# Patient Record
Sex: Male | Born: 1985 | Race: Black or African American | Hispanic: No | Marital: Single | Smoking: Current every day smoker
Health system: Southern US, Community
[De-identification: ages and names within clinical notes are randomized; demographics above are authoritative.]

## PROBLEM LIST (undated history)

## (undated) DIAGNOSIS — L309 Dermatitis, unspecified: Secondary | ICD-10-CM

---
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
## 2006-02-22 ENCOUNTER — Emergency Department (HOSPITAL_COMMUNITY): Admission: EM | Admit: 2006-02-22 | Discharge: 2006-02-22 | Payer: Self-pay | Admitting: Emergency Medicine

## 2008-09-26 ENCOUNTER — Emergency Department (HOSPITAL_COMMUNITY): Admission: EM | Admit: 2008-09-26 | Discharge: 2008-09-26 | Payer: Self-pay | Admitting: Emergency Medicine

## 2008-12-11 ENCOUNTER — Emergency Department (HOSPITAL_COMMUNITY): Admission: EM | Admit: 2008-12-11 | Discharge: 2008-12-11 | Payer: Self-pay | Admitting: Emergency Medicine

## 2010-04-19 ENCOUNTER — Emergency Department (HOSPITAL_COMMUNITY): Admission: EM | Admit: 2010-04-19 | Discharge: 2010-04-19 | Payer: Self-pay | Admitting: Emergency Medicine

## 2010-07-09 ENCOUNTER — Emergency Department (HOSPITAL_COMMUNITY): Admission: EM | Admit: 2010-07-09 | Discharge: 2010-07-09 | Payer: Self-pay | Admitting: Family Medicine

## 2010-08-19 ENCOUNTER — Emergency Department (HOSPITAL_COMMUNITY)
Admission: EM | Admit: 2010-08-19 | Discharge: 2010-08-19 | Payer: Self-pay | Source: Home / Self Care | Admitting: Family Medicine

## 2010-11-04 LAB — URINALYSIS, ROUTINE W REFLEX MICROSCOPIC
Hgb urine dipstick: NEGATIVE
Ketones, ur: NEGATIVE mg/dL
Nitrite: NEGATIVE
Protein, ur: NEGATIVE mg/dL
Specific Gravity, Urine: 1.029 (ref 1.005–1.030)
Urobilinogen, UA: 1 mg/dL (ref 0.0–1.0)
pH: 6 (ref 5.0–8.0)

## 2010-11-04 LAB — DIFFERENTIAL
Basophils Relative: 0 % (ref 0–1)
Eosinophils Absolute: 0.1 10*3/uL (ref 0.0–0.7)
Eosinophils Relative: 1 % (ref 0–5)
Lymphocytes Relative: 46 % (ref 12–46)
Monocytes Absolute: 0.6 10*3/uL (ref 0.1–1.0)
Neutrophils Relative %: 43 % (ref 43–77)

## 2010-11-04 LAB — CBC
Hemoglobin: 13.7 g/dL (ref 13.0–17.0)
Platelets: 214 10*3/uL (ref 150–400)
RBC: 4.94 MIL/uL (ref 4.22–5.81)
WBC: 5.8 10*3/uL (ref 4.0–10.5)

## 2010-11-04 LAB — BASIC METABOLIC PANEL
GFR calc non Af Amer: >60 mL/min (ref >60)
Potassium: 3.7 mEq/L (ref 3.5–5.1)

## 2010-11-30 LAB — RAPID STREP SCREEN (MED CTR MEBANE ONLY): Streptococcus, Group A Screen (Direct): NEGATIVE

## 2011-01-09 ENCOUNTER — Inpatient Hospital Stay (HOSPITAL_COMMUNITY)
Admission: RE | Admit: 2011-01-09 | Discharge: 2011-01-09 | Disposition: A | Discharge status: Home / Self Care | Payer: Self-pay | Source: Ambulatory Visit | Attending: Family Medicine | Admitting: Family Medicine

## 2011-02-08 ENCOUNTER — Emergency Department (HOSPITAL_COMMUNITY)
Admission: EM | Admit: 2011-02-08 | Discharge: 2011-02-08 | Discharge status: Against Medical Advice | Payer: Self-pay | Attending: Emergency Medicine | Admitting: Emergency Medicine

## 2011-02-08 DIAGNOSIS — L0231 Cutaneous abscess of buttock: Secondary | ICD-10-CM | POA: Insufficient documentation

## 2011-02-08 DIAGNOSIS — IMO0001 Reserved for inherently not codable concepts without codable children: Secondary | ICD-10-CM | POA: Insufficient documentation

## 2011-02-08 DIAGNOSIS — R109 Unspecified abdominal pain: Secondary | ICD-10-CM | POA: Insufficient documentation

## 2011-02-08 DIAGNOSIS — L02219 Cutaneous abscess of trunk, unspecified: Secondary | ICD-10-CM | POA: Insufficient documentation

## 2011-02-08 DIAGNOSIS — F17200 Nicotine dependence, unspecified, uncomplicated: Secondary | ICD-10-CM | POA: Insufficient documentation

## 2011-05-13 ENCOUNTER — Emergency Department (HOSPITAL_COMMUNITY)
Admission: EM | Admit: 2011-05-13 | Discharge: 2011-05-13 | Disposition: A | Discharge status: Home / Self Care | Payer: Self-pay | Attending: Emergency Medicine | Admitting: Emergency Medicine

## 2011-05-13 DIAGNOSIS — M542 Cervicalgia: Secondary | ICD-10-CM | POA: Insufficient documentation

## 2011-05-13 DIAGNOSIS — S139XXA Sprain of joints and ligaments of unspecified parts of neck, initial encounter: Secondary | ICD-10-CM | POA: Insufficient documentation

## 2011-05-21 IMAGING — US US ART/VEN ABD/PELV/SCROTUM DOPPLER LTD
1 series · 13 of 25 positions shown · non-contrast
Comparison: None available.

CLINICAL DATA: Testicular swelling and pain, lump

SCROTAL ULTRASOUND
DOPPLER ULTRASOUND OF THE TESTICLES
TECHNIQUE: Complete ultrasound examination of the testicles,
epididymis, and other scrotal structures was performed.  Color and
spectral Doppler ultrasound were also utilized to evaluate blood
flow to the testicles.

[Series 1: us art/ven abd/pelv/scrotum doppler ltd · 0.07mm/px · 13 of 60 slices shown]
[im 1/60]
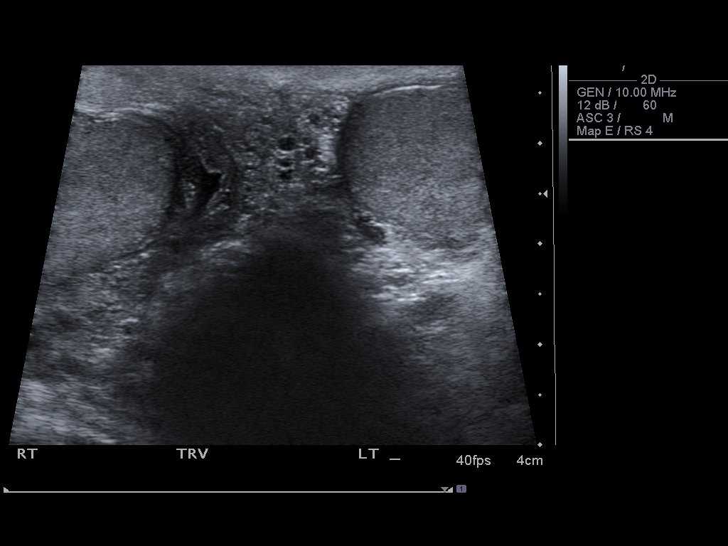
[im 5/60]
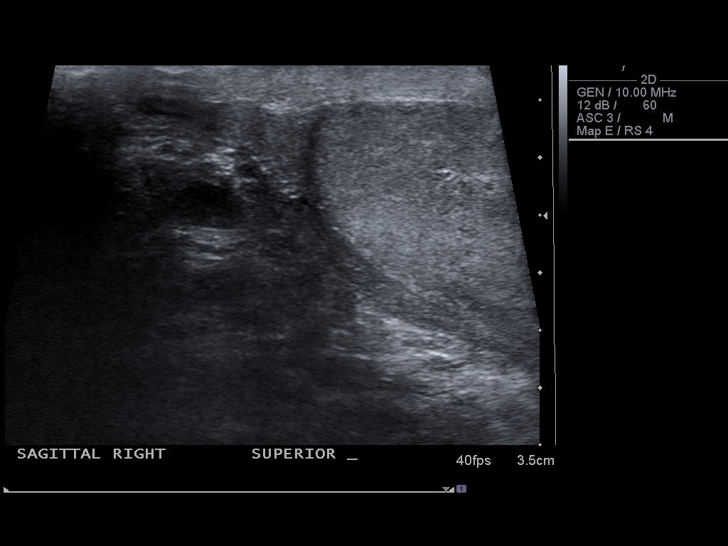
[im 10/60]
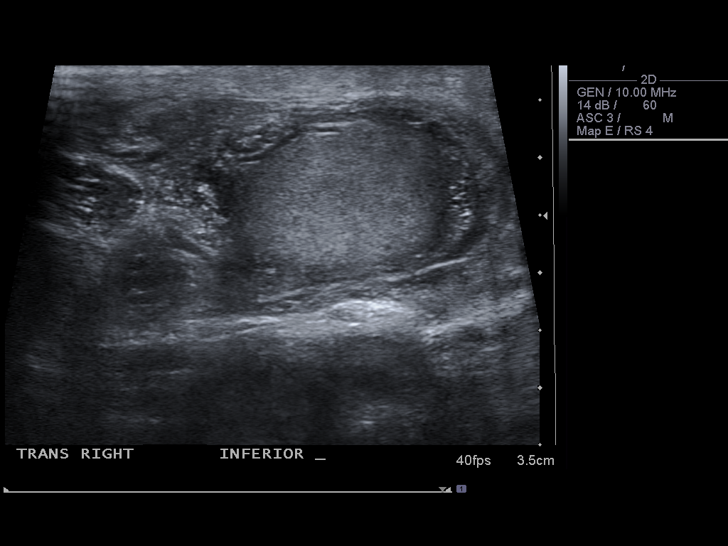
[im 15/60]
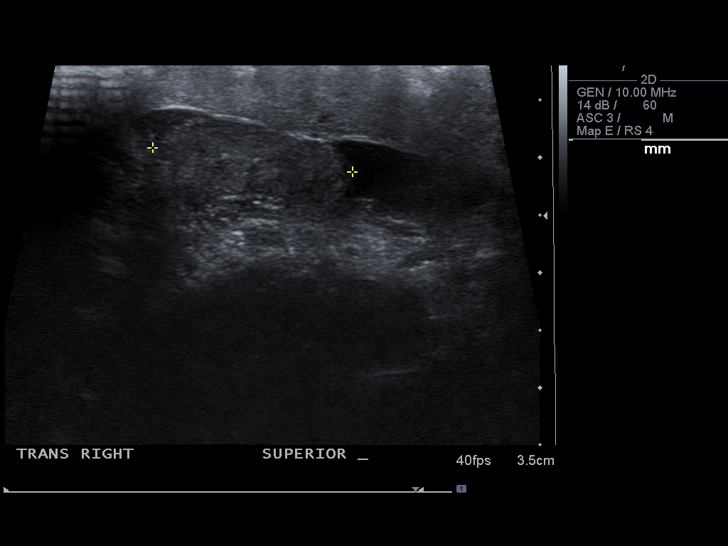
[im 20/60]
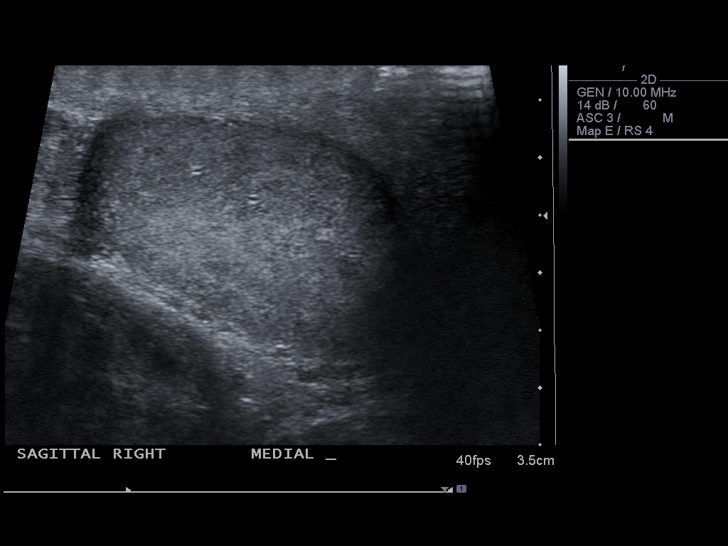
[im 25/60]
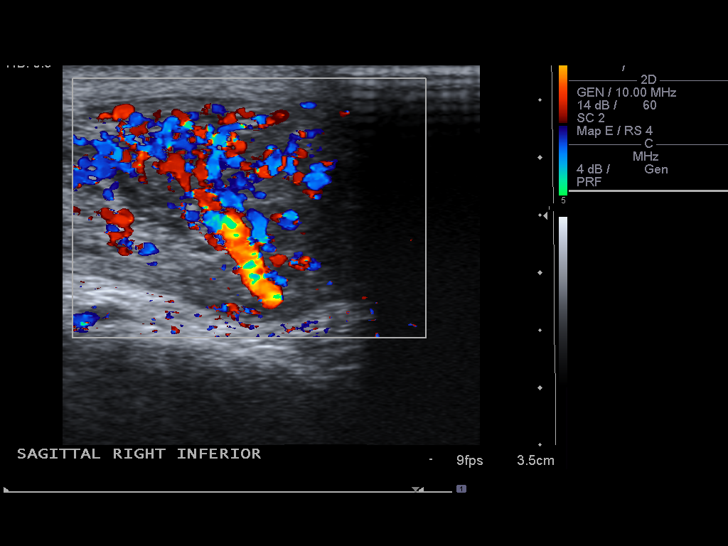
[im 30/60]
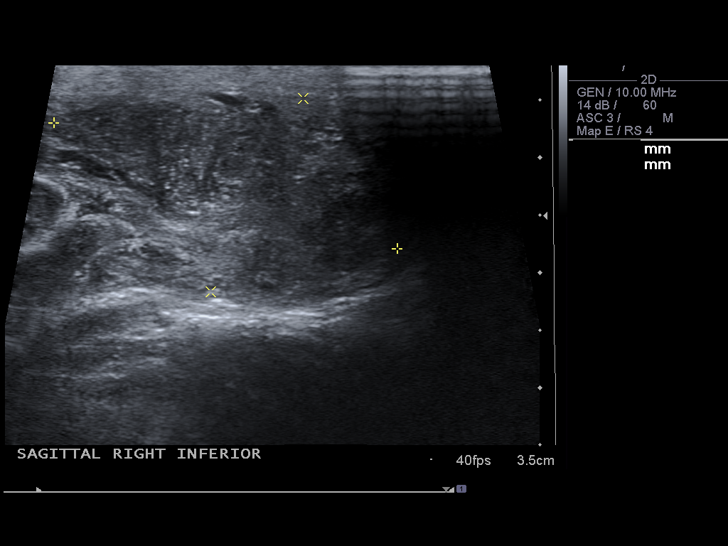
[im 35/60]
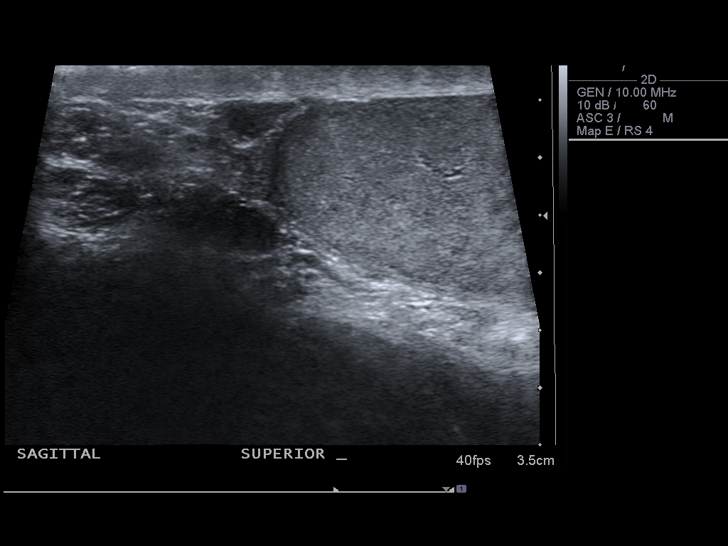
[im 40/60]
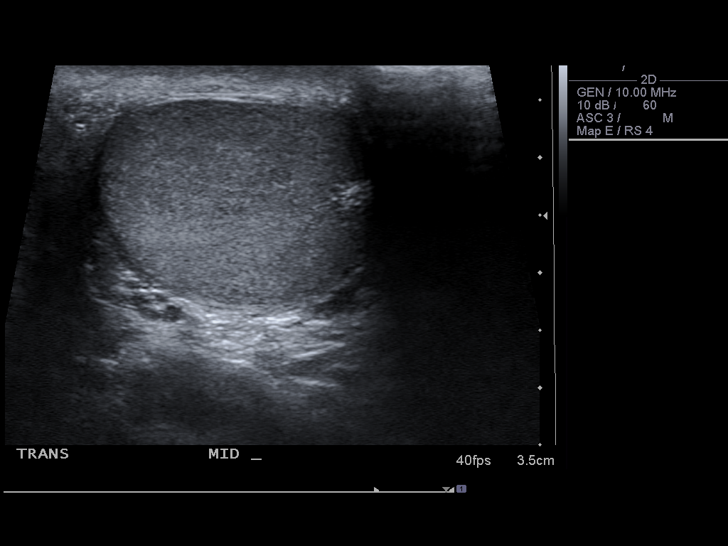
[im 45/60]
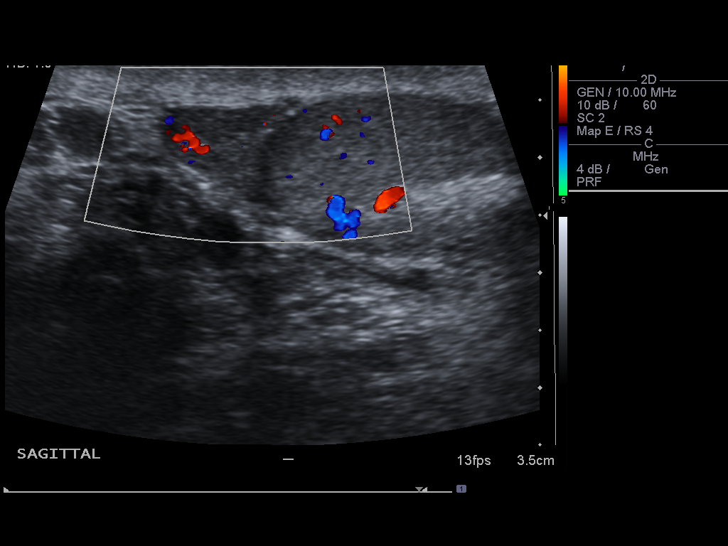
[im 50/60]
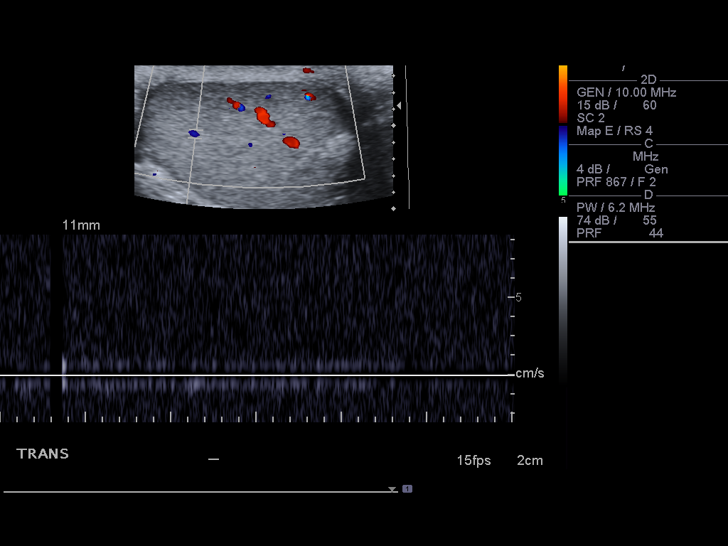
[im 55/60]
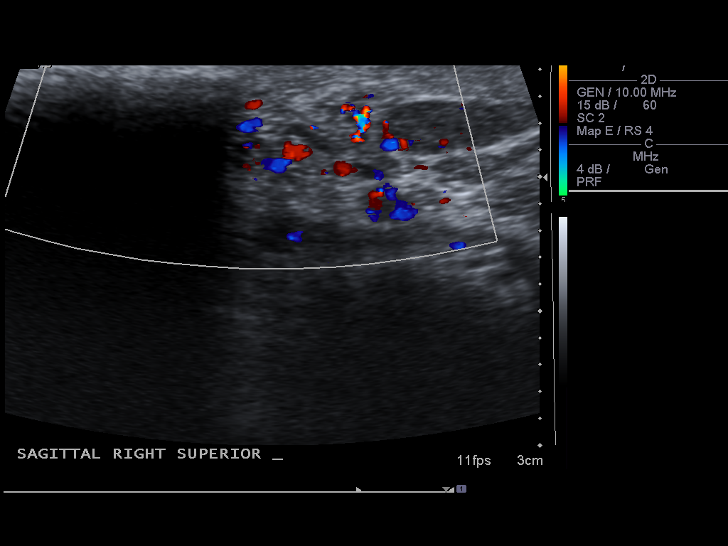
[im 60/60]
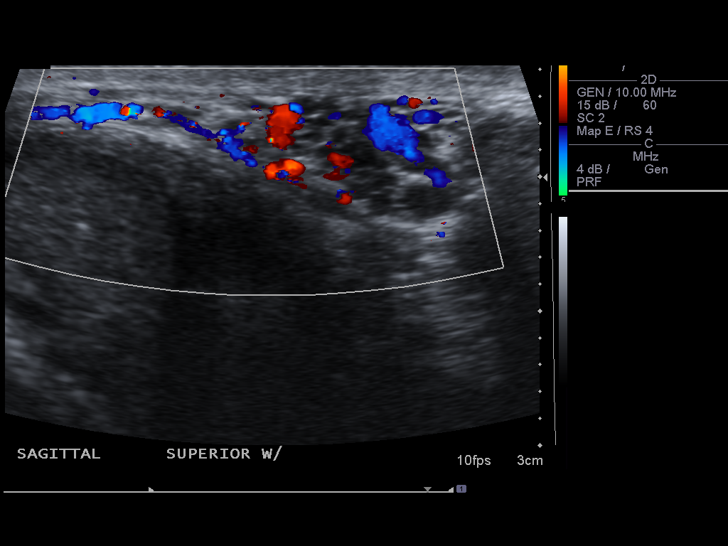

[13 of 25 positions shown; findings below may reference images not displayed]

FINDINGS: The testicles are symmetric in size and echogenicity.
The right testicle measures 3.3 x 2.0 x 2.8 cm.  The left testicle
measures 2.6 x 1.9 x 3.7 cm.  No testicular masses are seen, and
there is no evidence of microlithiasis.  The left epididymis is
normal in appearance.  There is increased flow and prominence of
the right epididymal body corresponding to the area of palpable
concern.  The right epididymal head is normal.  Bilateral
varicoceles are seen.  A small left hydrocele is seen.

Blood flow is seen within both testicles on color Doppler
sonography.  Doppler spectral waveforms show both arterial and
venous flow signal in both testicles.
IMPRESSION: 1.  No testicular mass or torsion.
2.  Hypervascular area and prominence of the right epididymal body
suggesting epididymitis.  This area corresponds to the palpable
abnormality.
3.  Small bilateral varicoceles and a small left hydrocele.

## 2011-10-27 ENCOUNTER — Encounter (HOSPITAL_COMMUNITY): Payer: Self-pay | Admitting: Emergency Medicine

## 2011-10-27 ENCOUNTER — Emergency Department (HOSPITAL_COMMUNITY)
Admission: EM | Admit: 2011-10-27 | Discharge: 2011-10-27 | Disposition: A | Discharge status: Home / Self Care | Payer: Self-pay | Attending: Emergency Medicine | Admitting: Emergency Medicine

## 2011-10-27 DIAGNOSIS — L0390 Cellulitis, unspecified: Secondary | ICD-10-CM

## 2011-10-27 DIAGNOSIS — L03317 Cellulitis of buttock: Secondary | ICD-10-CM | POA: Insufficient documentation

## 2011-10-27 DIAGNOSIS — L0231 Cutaneous abscess of buttock: Secondary | ICD-10-CM | POA: Insufficient documentation

## 2011-10-27 MED ORDER — SULFAMETHOXAZOLE-TRIMETHOPRIM 800-160 MG PO TABS: 1.0000 | ORAL_TABLET | Freq: Two times a day (BID) | ORAL | Status: AC

## 2011-10-27 MED ORDER — LIDOCAINE HCL 2 % IJ SOLN
10.0000 mL | Freq: Once | INTRAMUSCULAR | Status: AC
  Administered 2011-10-27: 200 mg
  Filled 2011-10-27: qty 1

## 2011-10-27 MED ORDER — OXYCODONE-ACETAMINOPHEN 5-325 MG PO TABS: 2.0000 | ORAL_TABLET | ORAL | Status: AC | PRN

## 2011-10-27 NOTE — Discharge Instructions (Signed)
Russell Holland we drain the abscess to your right buttocks today in the ER. There is packing inside the abscess presently. He needs to put hot compresses on this area and squeeze it re\re times a day. The packing may follow out but that's okay. Followup in 2 days with PCP from the list, urgent care, or return to the ER. Start her antibiotics tonight. Return if he had nausea vomiting or high fever.  Abscess An abscess (boil or furuncle) is an infected area under your skin. This area is filled with yellowish white fluid (pus). HOME CARE   Only take medicine as told by your doctor.   Keep the skin clean around your abscess. Keep clothes that may touch the abscess clean.   Change any bandages (dressings) as told by your doctor.   Avoid direct skin contact with other people. The infection can spread by skin contact with others.   Practice good hygiene and do not share personal care items.   Do not share athletic equipment, towels, or whirlpools. Shower after every practice or work out session.   If a draining area cannot be covered:   Do not play sports.   Children should not go to daycare until the wound has healed or until fluid (drainage) stops coming out of the wound.   See your doctor for a follow-up visit as told.  GET HELP RIGHT AWAY IF:   There is more pain, puffiness (swelling), and redness in the wound site.   There is fluid or bleeding from the wound site.   You have muscle aches, chills, fever, or feel sick.   You or your child has a temperature by mouth above 102 F (38.9 C), not controlled by medicine.   Your baby is older than 3 months with a rectal temperature of 102 F (38.9 C) or higher.  MAKE SURE YOU:   Understand these instructions.   Will watch your condition.   Will get help right away if you are not doing well or get worse.  Document Released: 01/24/2008 Document Revised: 07/27/2011 Document Reviewed: 01/24/2008 [facility] Patient Information 2012  [place], [place].Cellulitis Cellulitis is an infection of the tissue under the skin. The infected area is usually red and tender. This is caused by germs. These germs enter the body through cuts or sores. This usually happens in the arms or lower legs. HOME CARE   Take your medicine as told. Finish it even if you start to feel better.   If the infection is on the arm or leg, keep it raised (elevated).   Use a warm cloth on the infected area several times a day.   See your doctor for a follow-up visit as told.  GET HELP RIGHT AWAY IF:   You are tired or confused.   You throw up (vomit).   You have watery poop (diarrhea).   You feel ill and have muscle aches.   You have a fever.  MAKE SURE YOU:   Understand these instructions.   Will watch your condition.   Will get help right away if you are not doing well or get worse.  Document Released: 01/24/2008 Document Revised: 07/27/2011 Document Reviewed: 07/09/2009 [facility] Patient Information 2012 [place], [place].Abscess Care After An abscess (also called a boil or furuncle) is an infected area that contains a collection of pus. Signs and symptoms of an abscess include pain, tenderness, redness, or hardness, or you may feel a moveable soft area under your skin. An abscess can occur anywhere in  the body. The infection may spread to surrounding tissues causing cellulitis. A cut (incision) by the surgeon was made over your abscess and the pus was drained out. Gauze may have been packed into the space to provide a drain that will allow the cavity to heal from the inside outwards. The boil may be painful for 5 to 7 days. Most people with a boil do not have high fevers. Your abscess, if seen early, may not have localized, and may not have been lanced. If not, another appointment may be required for this if it does not get better on its own or with medications. HOME CARE INSTRUCTIONS   Only take over-the-counter or prescription medicines for pain,  discomfort, or fever as directed by your caregiver.   When you bathe, soak and then remove gauze or iodoform packs at least daily or as directed by your caregiver. You may then wash the wound gently with mild soapy water. Repack with gauze or do as your caregiver directs.  SEEK IMMEDIATE MEDICAL CARE IF:   You develop increased pain, swelling, redness, drainage, or bleeding in the wound site.   You develop signs of generalized infection including muscle aches, chills, fever, or a general ill feeling.   An oral temperature above 102 F (38.9 C) develops, not controlled by medication.  See your caregiver for a recheck if you develop any of the symptoms described above. If medications (antibiotics) were prescribed, take them as directed. Document Released: 02/23/2005 Document Revised: 07/27/2011 Document Reviewed: 10/21/2007 [facility] Patient Information 2012 [place], [place].

## 2011-10-27 NOTE — ED Provider Notes (Signed)
History     CSN: [id number]  Arrival date & time 10/27/11  1429   First MD Initiated Contact with Patient 10/27/11 1623      Chief Complaint  Patient presents with  . Abscess    RT buttock x over a year. was treated by a dermatologist but it keeps coming back.     (Consider location/radiation/quality/duration/timing/severity/associated sxs/prior treatment) Patient is a 26 y.o. male presenting with abscess. The history is provided by the patient. No language interpreter was used.  Abscess  This is a recurrent problem. The current episode started less than one week ago. The abscess is present on the right buttock. The problem is moderate. The abscess is characterized by redness and painfulness. Pertinent negatives include no decrease in physical activity, not sleeping less, no fever and no vomiting.   Finding of a recurrent abscess to his right buttocks. States that he went to a dermatologist and they put a shot in it and it went away for a while but now it is back. States it is dermatologist has retired. Denies fever nausea vomiting.  2 cm area of fluctuance with surrounding cellulitis the first glance. Will drain the abscess and put him on antibiotics   No past medical history on file.  No past surgical history on file.  No family history on file.  History  Substance Use Topics  . Smoking status: Not on file  . Smokeless tobacco: Not on file  . Alcohol Use: Not on file      Review of Systems  Constitutional: Negative for fever.  Gastrointestinal: Negative for vomiting.  All other systems reviewed and are negative.    Allergies  Review of patient's allergies indicates no known allergies.  Home Medications   Current Outpatient Rx  Name Route Sig Dispense Refill  . BIOTIN 5000 MCG PO CAPS Oral Take 5,000 mcg by mouth daily.    Marland Kitchen SALINE NASAL SPRAY 0.65 % NA SOLN Nasal Place 1 spray into the nose as needed. sinuses    . TETRAHYDROZOLINE HCL 0.05 % OP SOLN Both Eyes  Place 1 drop into both eyes 2 (two) times daily.      BP 138/76  Pulse 71  Temp(Src) 98.7 F (37.1 C) (Oral)  Resp 16  Ht 5\' 11"  (1.803 m)  Wt 185 lb (83.915 kg)  BMI 25.80 kg/m2  SpO2 100%  Physical Exam  Nursing note and vitals reviewed. Constitutional: He is oriented to person, place, and time. He appears well-developed and well-nourished.  HENT:  Head: Normocephalic and atraumatic.  Eyes: Conjunctivae and EOM are normal. Pupils are equal, round, and reactive to light.  Neck: Normal range of motion. Neck supple.  Cardiovascular: Normal rate.   Pulmonary/Chest: Effort normal. No respiratory distress.  Abdominal: Soft. He exhibits no distension.  Musculoskeletal: Normal range of motion.  Neurological: He is alert and oriented to person, place, and time. No cranial nerve deficit.  Skin: Skin is warm and dry.       R buttocks with abscess  Psychiatric: He has a normal mood and affect.    ED Course  INCISION AND DRAINAGE Date/Time: 10/27/2011 5:31 PM Performed by: Jethro Bastos Authorized by: Jethro Bastos Consent: Verbal consent obtained. Risks and benefits: risks, benefits and alternatives were discussed Consent given by: patient Patient understanding: patient states understanding of the procedure being performed Patient identity confirmed: verbally with patient, arm band, provided demographic data and hospital-assigned identification number Time out: Immediately prior to procedure a "time out"  was called to verify the correct patient, procedure, equipment, support staff and site/side marked as required. Type: abscess Location: buttocks. Anesthesia: local infiltration Local anesthetic: lidocaine 2% without epinephrine Patient sedated: no Scalpel size: 11 Needle gauge: 22 Incision type: single straight Complexity: simple Drainage: serosanguinous Drainage amount: scant Wound treatment: wound left open Packing material: 1/4 in iodoform gauze Patient tolerance:  Patient tolerated the procedure well with no immediate complications.   (including critical care time)  Labs Reviewed - No data to display No results found.   No diagnosis found.    MDM   26yo male with c/o recurrent R buttocks abscess/cellulitis.  Drained a small amount of purulent drainage with 4cm area of cellulitis.  No fever nausea or vomiting.  Rx for  bactrum ds. Recheck in 2 days.     Jethro Bastos, NP 10/28/11 1133

## 2011-10-28 NOTE — ED Provider Notes (Signed)
Medical screening examination/treatment/procedure(s) were performed by non-physician practitioner and as supervising physician I was immediately available for consultation/collaboration.   Leigh-Ann Haygen Zebrowski, MD 10/28/11 1554 

## 2011-10-29 ENCOUNTER — Emergency Department (HOSPITAL_COMMUNITY)
Admission: EM | Admit: 2011-10-29 | Discharge: 2011-10-29 | Disposition: A | Discharge status: Home / Self Care | Payer: Self-pay | Attending: Emergency Medicine | Admitting: Emergency Medicine

## 2011-10-29 ENCOUNTER — Encounter (HOSPITAL_COMMUNITY): Payer: Self-pay | Admitting: *Deleted

## 2011-10-29 DIAGNOSIS — Z5189 Encounter for other specified aftercare: Secondary | ICD-10-CM | POA: Insufficient documentation

## 2011-10-29 NOTE — ED Provider Notes (Signed)
History     CSN: [id number]  Arrival date & time 10/29/11  1248   First MD Initiated Contact with Patient 10/29/11 1457      Chief Complaint  Patient presents with  . Wound Check    to remove packing from abscess on buttock    (Consider location/radiation/quality/duration/timing/severity/associated sxs/prior treatment) Patient is a 26 y.o. male presenting with wound check. The history is provided by the patient. No language interpreter was used.  Wound Check  He was treated in the ED 2 to 3 days ago. Previous treatment in the ED includes I&D of abscess. Treatments since wound repair include oral antibiotics, antibiotic ointment use and regular soap and water washings. There has been no drainage from the wound. The redness has improved. The swelling has improved. The pain has improved. He has no difficulty moving the affected extremity or digit.   Reports he is here for a abscess recheck. States that he has been getting some purulent drainage when he uses the hot compresses and squeezes.  He was here 2 days ago for I & D abscess on his right buttock approximately 4 cm in diameter. Denies fever purulent drainage states that he he feels better the pain is less he is not having to take his pain medication and history changing the dressings regularly. Started his antibiotics today and half ago. Denies fever.  History reviewed. No pertinent past medical history.  History reviewed. No pertinent past surgical history.  History reviewed. No pertinent family history.  History  Substance Use Topics  . Smoking status: Current Some Day Smoker -- 0.5 packs/day    Types: Cigarettes  . Smokeless tobacco: Never Used  . Alcohol Use: Yes     occ      Review of Systems  All other systems reviewed and are negative.    Allergies  Review of patient's allergies indicates no known allergies.  Home Medications   Current Outpatient Rx  Name Route Sig Dispense Refill  . BIOTIN 5000 MCG PO CAPS  Oral Take 5,000 mcg by mouth daily.    . OXYCODONE-ACETAMINOPHEN 5-325 MG PO TABS Oral Take 2 tablets by mouth every 4 (four) hours as needed for pain. 15 tablet 0  . SALINE NASAL SPRAY 0.65 % NA SOLN Nasal Place 1 spray into the nose as needed. sinuses    . SULFAMETHOXAZOLE-TRIMETHOPRIM 800-160 MG PO TABS Oral Take 1 tablet by mouth every 12 (twelve) hours. 10 tablet 0  . TETRAHYDROZOLINE HCL 0.05 % OP SOLN Both Eyes Place 1 drop into both eyes 2 (two) times daily.      BP 135/78  Pulse 66  Temp(Src) 98.3 F (36.8 C) (Oral)  Resp 18  SpO2 100%  Physical Exam  Nursing note and vitals reviewed. Constitutional: He is oriented to person, place, and time. He appears well-developed and well-nourished.  HENT:  Head: Normocephalic and atraumatic.  Eyes: Pupils are equal, round, and reactive to light.  Neck: Normal range of motion. Neck supple.  Cardiovascular: Normal rate and regular rhythm.   Pulmonary/Chest: Effort normal.  Abdominal: Soft. He exhibits no distension.  Musculoskeletal: Normal range of motion.  Neurological: He is alert and oriented to person, place, and time. No cranial nerve deficit.  Skin: Skin is warm and dry.  Psychiatric: He has a normal mood and affect.    ED Course  Procedures (including critical care time)  Labs Reviewed - No data to display No results found.   No diagnosis found.    MDM  26 year old male for abscess recheck right buttock. Taking Bactrim afebrile. States that he feels better he's not having any pain medication. Minimal purulent drainage the first day. Instructed to change daily and continue hot compresses. He will return to the ER if he has fever or excessive purulent drainage the area becomes worse.       Jethro Bastos, NP 10/30/11 1205

## 2011-10-29 NOTE — ED Notes (Signed)
Pt from home, was told to return to [facility] for packing removal from right buttock abscess.

## 2011-10-29 NOTE — Discharge Instructions (Signed)
Russell Holland your abscess looks better today.  No purulent drainage presently. Continue changing the dressing at least once a day. Continue the hot compresses and squeezing. Continue the antibiotics. Return if you have excessive purulent drainage from the area or if she developed a fever.

## 2011-11-01 NOTE — ED Provider Notes (Signed)
Medical screening examination/treatment/procedure(s) were performed by non-physician practitioner and as supervising physician I was immediately available for consultation/collaboration.   Amirrah Quigley A Leng Montesdeoca, MD 11/01/11 1639 

## 2012-07-27 ENCOUNTER — Encounter (HOSPITAL_COMMUNITY): Payer: Self-pay | Admitting: Emergency Medicine

## 2012-07-27 ENCOUNTER — Emergency Department (HOSPITAL_COMMUNITY)
Admission: EM | Admit: 2012-07-27 | Discharge: 2012-07-27 | Disposition: A | Discharge status: Home / Self Care | Payer: Self-pay | Source: Home / Self Care | Attending: Emergency Medicine | Admitting: Emergency Medicine

## 2012-07-27 DIAGNOSIS — L309 Dermatitis, unspecified: Secondary | ICD-10-CM

## 2012-07-27 DIAGNOSIS — L259 Unspecified contact dermatitis, unspecified cause: Secondary | ICD-10-CM

## 2012-07-27 MED ORDER — TRIAMCINOLONE ACETONIDE 0.1 % EX CREA: TOPICAL_CREAM | Freq: Three times a day (TID) | CUTANEOUS | Status: DC

## 2012-07-27 NOTE — ED Notes (Signed)
Pt c/o rash on left calf muscle x2 months... Sx include: occasional itching, nauseas... Denies: fevers, vomiting, diarrhea... He is alert w/no signs of distress.

## 2012-07-27 NOTE — ED Provider Notes (Signed)
Chief Complaint  Patient presents with  . Rash    History of Present Illness:   Russell Holland is a 26 year old male who's had a 2 to three-month history of a mildly pruritic rash in his left lower leg. He cannot think of anything that is coming contact with including soaps, detergents, washing powders, dryer sheets, fabric softeners. No exposure to plants or animals. No exposure to chemicals at home or at work. No new cosmetics or skin care products. He has not eaten any new foods or taken any new medications. He denies any rash elsewhere. He's had no difficulty breathing or swelling of his lips, tongue, or throat.  Review of Systems:  Other than noted above, the patient denies any of the following symptoms: Systemic:  No fever, chills, sweats, weight loss, or fatigue. ENT:  No nasal congestion, rhinorrhea, sore throat, swelling of lips, tongue or throat. Resp:  No cough, wheezing, or shortness of breath. Skin:  No rash, itching, nodules, or suspicious lesions.  PMFSH:  Past medical history, family history, social history, meds, and allergies were reviewed.  Physical Exam:   Vital signs:  BP 124/65  Pulse 58  Temp 98.9 F (37.2 C) (Oral)  Resp 17  SpO2 99% Gen:  Alert, oriented, in no distress. ENT:  Pharynx clear, no intraoral lesions, moist mucous membranes. Lungs:  Clear to auscultation. Skin:  There is a hyperpigmented, eczematous rash on the left, lower, lateral leg.  Assessment:  The encounter diagnosis was Eczema.  Plan:   1.  The following meds were prescribed:   New Prescriptions   TRIAMCINOLONE CREAM (KENALOG) 0.1 %    Apply topically 3 (three) times daily.   2.  The patient was instructed in symptomatic care and handouts were given. He was given general skin care instructions for eczema.  3.  The patient was told to return if becoming worse in any way, if no better in 3 or 4 days, and given some red flag symptoms that would indicate earlier return.     Reuben Likes,  MD 07/27/12 2039

## 2012-10-12 ENCOUNTER — Emergency Department (INDEPENDENT_AMBULATORY_CARE_PROVIDER_SITE_OTHER): Payer: Self-pay

## 2012-10-12 ENCOUNTER — Encounter (HOSPITAL_COMMUNITY): Payer: Self-pay | Admitting: *Deleted

## 2012-10-12 ENCOUNTER — Emergency Department (INDEPENDENT_AMBULATORY_CARE_PROVIDER_SITE_OTHER)
Admission: EM | Admit: 2012-10-12 | Discharge: 2012-10-12 | Disposition: A | Discharge status: Home / Self Care | Payer: Self-pay | Source: Home / Self Care | Attending: Family Medicine | Admitting: Family Medicine

## 2012-10-12 DIAGNOSIS — S63509A Unspecified sprain of unspecified wrist, initial encounter: Secondary | ICD-10-CM

## 2012-10-12 DIAGNOSIS — S63501A Unspecified sprain of right wrist, initial encounter: Secondary | ICD-10-CM

## 2012-10-12 HISTORY — DX: Dermatitis, unspecified: L30.9

## 2012-10-12 MED ORDER — DICLOFENAC POTASSIUM 50 MG PO TABS: 50.0000 mg | ORAL_TABLET | Freq: Three times a day (TID) | ORAL | Status: DC

## 2012-10-12 MED ORDER — CYCLOBENZAPRINE HCL 5 MG PO TABS: 5.0000 mg | ORAL_TABLET | Freq: Three times a day (TID) | ORAL | Status: DC | PRN
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
## 2012-11-24 ENCOUNTER — Emergency Department (HOSPITAL_COMMUNITY)
Admission: EM | Admit: 2012-11-24 | Discharge: 2012-11-24 | Disposition: A | Discharge status: Home / Self Care | Payer: Self-pay | Attending: Emergency Medicine | Admitting: Emergency Medicine

## 2012-11-24 ENCOUNTER — Encounter (HOSPITAL_COMMUNITY): Payer: Self-pay | Admitting: Emergency Medicine

## 2012-11-24 DIAGNOSIS — L0291 Cutaneous abscess, unspecified: Secondary | ICD-10-CM

## 2012-11-24 DIAGNOSIS — Z872 Personal history of diseases of the skin and subcutaneous tissue: Secondary | ICD-10-CM | POA: Insufficient documentation

## 2012-11-24 DIAGNOSIS — Z79899 Other long term (current) drug therapy: Secondary | ICD-10-CM | POA: Insufficient documentation

## 2012-11-24 DIAGNOSIS — L0231 Cutaneous abscess of buttock: Secondary | ICD-10-CM | POA: Insufficient documentation

## 2012-11-24 DIAGNOSIS — F17200 Nicotine dependence, unspecified, uncomplicated: Secondary | ICD-10-CM | POA: Insufficient documentation

## 2012-11-24 NOTE — ED Provider Notes (Signed)
Medical screening examination/treatment/procedure(s) were performed by non-physician practitioner and as supervising physician I was immediately available for consultation/collaboration.   Richardean Canal, MD 11/24/12 2158

## 2012-11-24 NOTE — ED Notes (Signed)
Red, raides, tender area on low back. 2cm diameter. No drainage noted

## 2012-11-24 NOTE — ED Provider Notes (Signed)
History    This chart was scribed for non-physician practitioner, Renne Crigler PA-C working with Richardean Canal, MD by Smitty Pluck, ED scribe. This patient was seen in room WTR7/WTR7 and the patient's care was started at 4:25 PM.   CSN: [id number]  Arrival date & time 11/24/12  1606      Chief Complaint  Patient presents with  . Abscess    redness and swelling, 2cm diameter, low back.      The history is provided by the patient. No language interpreter was used.   Russell Holland is a 27 y.o. male who presents to the Emergency Department complaining of abscess on lower back onset 2 weeks ago causing constant, moderate pain at site on lower back. He reports that abscess is getting larger. Pain is rated at 7/10. He denies drainage from abscess. He denies taking medication PTA. He reports hx of abscess and eczema. Pt denies fever, chills, nausea, vomiting, diarrhea, weakness, cough, SOB and any other pain. Sitting makes pain worse. Nothing makes it better.    Past Medical History  Diagnosis Date  . Eczema     No past surgical history on file.  Family History  Problem Relation Age of Onset  . Diabetes Other     History  Substance Use Topics  . Smoking status: Current Some Day Smoker -- 0.50 packs/day    Types: Cigarettes  . Smokeless tobacco: Never Used  . Alcohol Use: Yes     Comment: occ      Review of Systems  Constitutional: Negative for fever and chills.  Respiratory: Negative for shortness of breath.   Gastrointestinal: Negative for nausea and vomiting.  Skin: Negative for color change.       Positive for abscess  Neurological: Negative for weakness.  Hematological: Negative for adenopathy.    Allergies  Review of patient's allergies indicates no known allergies.  Home Medications   Current Outpatient Rx  Name  Route  Sig  Dispense  Refill  . cyclobenzaprine (FLEXERIL) 5 MG tablet   Oral   Take 1 tablet (5 mg total) by mouth 3 (three) times daily as  needed for muscle spasms.   30 tablet   0   . diclofenac (CATAFLAM) 50 MG tablet   Oral   Take 1 tablet (50 mg total) by mouth 3 (three) times daily. For neck pain   30 tablet   0   . tetrahydrozoline (VISINE EXTRA) 0.05 % ophthalmic solution   Both Eyes   Place 1 drop into both eyes 2 (two) times daily.         Marland Kitchen triamcinolone cream (KENALOG) 0.1 %   Topical   Apply topically 3 (three) times daily.   454 g   3     BP 118/74  Temp(Src) 98.3 F (36.8 C) (Oral)  SpO2 100%  Physical Exam  Nursing note and vitals reviewed. Constitutional: He is oriented to person, place, and time. He appears well-developed and well-nourished. No distress.  HENT:  Head: Normocephalic and atraumatic.  Eyes: Conjunctivae and EOM are normal.  Neck: Normal range of motion. Neck supple. No tracheal deviation present.  Cardiovascular: Normal rate.   Pulmonary/Chest: Effort normal. No respiratory distress.  Musculoskeletal: Normal range of motion.  Neurological: He is alert and oriented to person, place, and time.  Skin: Skin is warm and dry.  Midline, 3-4 cm superior to gluteal cleft there is a 3 cm diameter area of tender induration without overlying erythema  Psychiatric: He has a normal mood and affect. His behavior is normal.    ED Course  Procedures (including critical care time) DIAGNOSTIC STUDIES: Oxygen Saturation is 100% on room air, normal by my interpretation.    COORDINATION OF CARE: 4:27 PM Discussed ED treatment with pt and pt agrees.   INCISION AND DRAINAGE Performed by: Renne Crigler PA-C Consent: Verbal consent obtained. Risks and benefits: risks, benefits and alternatives were discussed Type: abscess  Body area: 3 cm above gluteal cleft   Anesthesia: local infiltration  Incision was made with a scalpel.  Local anesthetic: lidocaine 2% with epinephrine  Anesthetic total: 2 ml  Complexity: complex Blunt dissection to break up loculations  Drainage:  purulent  Drainage amount: mild  Packing material: none  Patient tolerance: Patient tolerated the procedure well with no immediate complications.     Labs Reviewed - No data to display No results found.   1. Abscess    Patient seen and examined. Ultrasound used to localize abscess. There is approximately 1 cm hyperechoic reflection consistent with fluid. Patient agrees to proceed with incision and drainage.   Vital signs reviewed and are as follows: Filed Vitals:   11/24/12 1640  BP: 118/74  Temp: 98.3 F (36.8 C)   5:10 PM The patient was urged to return to the Emergency Department urgently with worsening pain, swelling, expanding erythema especially if it streaks away from the affected area, fever, or if they have any other concerns.   The patient was urged to return to the Emergency Department or go to their PCP in 48 hours for wound recheck if the area is not significantly improved.  The patient verbalized understanding and stated agreement with this plan.      MDM  Patient with skin abscess amenable to incision and drainage. No signs of cellulitis is surrounding skin.  Will d/c to home.  No antibiotic therapy is indicated.  There appears to be pre-existing cyst which may need to be excised. Patient given dermatology referral.      I personally performed the services described in this documentation, which was scribed in my presence. The recorded information has been reviewed and is accurate.    Renne Crigler, PA-C 11/24/12 1711  Renne Crigler, PA-C 11/24/12 1712

## 2013-05-30 ENCOUNTER — Encounter (HOSPITAL_COMMUNITY): Payer: Self-pay | Admitting: Emergency Medicine

## 2013-05-30 ENCOUNTER — Emergency Department (HOSPITAL_COMMUNITY)
Admission: EM | Admit: 2013-05-30 | Discharge: 2013-05-30 | Disposition: A | Discharge status: Home / Self Care | Payer: Self-pay | Attending: Emergency Medicine | Admitting: Emergency Medicine

## 2013-05-30 DIAGNOSIS — Y929 Unspecified place or not applicable: Secondary | ICD-10-CM | POA: Insufficient documentation

## 2013-05-30 DIAGNOSIS — Z79899 Other long term (current) drug therapy: Secondary | ICD-10-CM | POA: Insufficient documentation

## 2013-05-30 DIAGNOSIS — F17200 Nicotine dependence, unspecified, uncomplicated: Secondary | ICD-10-CM | POA: Insufficient documentation

## 2013-05-30 DIAGNOSIS — Z872 Personal history of diseases of the skin and subcutaneous tissue: Secondary | ICD-10-CM | POA: Insufficient documentation

## 2013-05-30 DIAGNOSIS — S0990XA Unspecified injury of head, initial encounter: Secondary | ICD-10-CM

## 2013-05-30 DIAGNOSIS — S0993XA Unspecified injury of face, initial encounter: Secondary | ICD-10-CM | POA: Insufficient documentation

## 2013-05-30 DIAGNOSIS — W268XXA Contact with other sharp object(s), not elsewhere classified, initial encounter: Secondary | ICD-10-CM | POA: Insufficient documentation

## 2013-05-30 DIAGNOSIS — Y939 Activity, unspecified: Secondary | ICD-10-CM | POA: Insufficient documentation

## 2013-05-30 NOTE — ED Notes (Signed)
Pt stated he has a spot on the top of head. Pt not sure how he injured his head.

## 2013-05-30 NOTE — Progress Notes (Signed)
[facility] provided pt with a [facility] [employer] Card application.  °
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
## 2013-05-31 NOTE — ED Provider Notes (Signed)
Medical screening examination/treatment/procedure(s) were performed by non-physician practitioner and as supervising physician I was immediately available for consultation/collaboration.   Russell Holland. Oletta Lamas, MD 05/31/13 [phone]

## 2013-08-28 ENCOUNTER — Emergency Department (HOSPITAL_COMMUNITY)
Admission: EM | Admit: 2013-08-28 | Discharge: 2013-08-28 | Disposition: A | Discharge status: Home / Self Care | Payer: Self-pay | Attending: Emergency Medicine | Admitting: Emergency Medicine

## 2013-08-28 ENCOUNTER — Encounter (HOSPITAL_COMMUNITY): Payer: Self-pay | Admitting: Emergency Medicine

## 2013-08-28 DIAGNOSIS — R3919 Other difficulties with micturition: Secondary | ICD-10-CM | POA: Insufficient documentation

## 2013-08-28 DIAGNOSIS — R229 Localized swelling, mass and lump, unspecified: Secondary | ICD-10-CM | POA: Insufficient documentation

## 2013-08-28 DIAGNOSIS — Z87891 Personal history of nicotine dependence: Secondary | ICD-10-CM | POA: Insufficient documentation

## 2013-08-28 DIAGNOSIS — R3989 Other symptoms and signs involving the genitourinary system: Secondary | ICD-10-CM

## 2013-08-28 LAB — CBC WITH DIFFERENTIAL/PLATELET
Basophils Absolute: 0 10*3/uL (ref 0.0–0.1)
Basophils Relative: 0 % (ref 0–1)
Eosinophils Absolute: 0.1 10*3/uL (ref 0.0–0.7)
Eosinophils Relative: 2 % (ref 0–5)
HCT: 44.2 % (ref 39.0–52.0)
Hemoglobin: 14.6 g/dL (ref 13.0–17.0)
Lymphocytes Relative: 52 % — ABNORMAL HIGH (ref 12–46)
Lymphs Abs: 1.8 10*3/uL (ref 0.7–4.0)
MCH: 28 pg (ref 26.0–34.0)
MCHC: 33 g/dL (ref 30.0–36.0)
MCV: 84.7 fL (ref 78.0–100.0)
Monocytes Absolute: 0.4 10*3/uL (ref 0.1–1.0)
Monocytes Relative: 10 % (ref 3–12)
Neutro Abs: 1.3 10*3/uL — ABNORMAL LOW (ref 1.7–7.7)
Neutrophils Relative %: 36 % — ABNORMAL LOW (ref 43–77)
Platelets: 220 10*3/uL (ref 150–400)
RBC: 5.22 MIL/uL (ref 4.22–5.81)
RDW: 14.6 % (ref 11.5–15.5)
WBC: 3.5 10*3/uL — ABNORMAL LOW (ref 4.0–10.5)

## 2013-08-28 LAB — COMPREHENSIVE METABOLIC PANEL
ALT: 20 U/L (ref 0–53)
AST: 21 U/L (ref 0–37)
Albumin: 4.4 g/dL (ref 3.5–5.2)
Alkaline Phosphatase: 50 U/L (ref 39–117)
BUN: 8 mg/dL (ref 6–23)
CO2: 30 mEq/L (ref 19–32)
Calcium: 9.6 mg/dL (ref 8.4–10.5)
Chloride: 103 mEq/L (ref 96–112)
Creatinine, Ser: 0.81 mg/dL (ref 0.50–1.35)
GFR calc Af Amer: >90 mL/min (ref >90)
GFR calc non Af Amer: >90 mL/min (ref >90)
Glucose, Bld: 100 mg/dL — ABNORMAL HIGH (ref 70–99)
Potassium: 4.3 mEq/L (ref 3.7–5.3)
Sodium: 142 mEq/L (ref 137–147)
Total Bilirubin: 0.4 mg/dL (ref 0.3–1.2)
Total Protein: 7.3 g/dL (ref 6.0–8.3)

## 2013-08-28 LAB — URINALYSIS, ROUTINE W REFLEX MICROSCOPIC
Bilirubin Urine: NEGATIVE
GLUCOSE, UA: NEGATIVE mg/dL
Hgb urine dipstick: NEGATIVE
KETONES UR: NEGATIVE mg/dL
LEUKOCYTES UA: NEGATIVE
NITRITE: NEGATIVE
PH: 5.5 (ref 5.0–8.0)
Protein, ur: NEGATIVE mg/dL
SPECIFIC GRAVITY, URINE: 1.019 (ref 1.005–1.030)
Urobilinogen, UA: 0.2 mg/dL (ref 0.0–1.0)

## 2013-08-28 LAB — CK: Total CK: 141 U/L (ref 7–232)

## 2013-08-28 MED ORDER — SODIUM CHLORIDE 0.9 % IV BOLUS (SEPSIS)
1000.0000 mL | Freq: Once | INTRAVENOUS | Status: AC
  Administered 2013-08-28: 1000 mL via INTRAVENOUS

## 2013-08-28 NOTE — ED Notes (Signed)
Pt states he noted blood in urine this past Saturday, none since, denies burning with urination or discharge

## 2013-08-28 NOTE — Progress Notes (Signed)
[facility] provided pt with a list of primary care resources, [facility] information, and a [facility] [employer] Card application.  °

## 2013-08-28 NOTE — ED Provider Notes (Signed)
CSN: [id number]     Arrival date & time 08/28/13  1209 History   First MD Initiated Contact with Patient 08/28/13 1503     Chief Complaint  Patient presents with  . Hematuria   (Consider location/radiation/quality/duration/timing/severity/associated sxs/prior Treatment) HPI Patient presents to the emergency department with a discoloration of his urine that occurred last Saturday.  Patient, states he did not have any abdominal pain, chest pain, shortness of breath, nausea, vomiting, diarrhea, headache, weakness, dizziness, rash or syncope.  The patient, states, that he did notice a little small area of swelling to the right testicular area.  Patient, states this is not painful.  He denies any urinary incontinence or pain with urination.  Patient, states he's not had any urethral discharge Past Medical History  Diagnosis Date  . Eczema    History reviewed. No pertinent past surgical history. Family History  Problem Relation Age of Onset  . Diabetes Other    History  Substance Use Topics  . Smoking status: Former Smoker -- 0.50 packs/day    Types: Cigarettes  . Smokeless tobacco: Never Used  . Alcohol Use: No    Review of Systems All other systems negative except as documented in the HPI. All pertinent positives and negatives as reviewed in the HPI. Allergies  Review of patient's allergies indicates no known allergies.  Home Medications   Current Outpatient Rx  Name  Route  Sig  Dispense  Refill  . Multiple Vitamin (MULTIVITAMIN WITH MINERALS) TABS tablet   Oral   Take 1 tablet by mouth daily.          BP 141/77  Pulse 63  Temp(Src) 98 F (36.7 C) (Oral)  Resp 18  Ht 6' (1.829 m)  Wt 187 lb (84.823 kg)  BMI 25.36 kg/m2  SpO2 100% Physical Exam  Nursing note and vitals reviewed. Constitutional: He is oriented to person, place, and time. He appears well-developed and well-nourished. No distress.  HENT:  Head: Normocephalic and atraumatic.  Cardiovascular: Normal  rate, regular rhythm and normal heart sounds.  Exam reveals no gallop and no friction rub.   No murmur heard. Pulmonary/Chest: Effort normal and breath sounds normal. No respiratory distress.  Abdominal: Hernia confirmed negative in the right inguinal area and confirmed negative in the left inguinal area.  Genitourinary: Penis normal.    Right testis shows no swelling and no tenderness. Right testis is descended. Left testis shows no swelling and no tenderness. Left testis is descended.  Lymphadenopathy:       Right: No inguinal adenopathy present.       Left: No inguinal adenopathy present.  Neurological: He is alert and oriented to person, place, and time. He exhibits normal muscle tone. Coordination normal.  Skin: Skin is warm and dry. No rash noted.    ED Course  Procedures (including critical care time) Labs Review Labs Reviewed  CBC WITH DIFFERENTIAL - Abnormal; Notable for the following:    WBC 3.5 (*)    Neutrophils Relative % 36 (*)    Neutro Abs 1.3 (*)    Lymphocytes Relative 52 (*)    All other components within normal limits  COMPREHENSIVE METABOLIC PANEL - Abnormal; Notable for the following:    Glucose, Bld 100 (*)    All other components within normal limits  URINALYSIS, ROUTINE W REFLEX MICROSCOPIC  CK   Patient is explained that at this point unclear what caused the change in urine color.  Patient will be referred to urology in the [facility].  He is advised of her test results.  All questions were answered.  Advised the patient is swollen area in the epididymis could represent a varicocele or a small cyst but does not appear to be concerning at this point, but needs to be followed along closely.  Patient, states, that he will followup with the urologist for this area and the urinary color change   Carlyle DollyChristopher W Aylin Rhoads, PA-C 08/28/13 1723

## 2013-08-28 NOTE — ED Notes (Signed)
Patient reports that he had an episode of hematuria x 1, 5 days ago. Patient also reports a lump in his right testicle.

## 2013-08-28 NOTE — ED Notes (Signed)
Bed: WA04 Expected date:  Expected time:  Means of arrival:  Comments: 

## 2013-08-28 NOTE — Discharge Instructions (Signed)
Return here as needed.  Followup with the urologist and the clinic provided. increase your fluid intake

## 2013-08-28 NOTE — ED Provider Notes (Signed)
Medical screening examination/treatment/procedure(s) were performed by non-physician practitioner and as supervising physician I was immediately available for consultation/collaboration.   Celene KrasJon R Ellakate Gonsalves, MD 08/28/13 [id number]
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
## 2020-06-17 ENCOUNTER — Ambulatory Visit (HOSPITAL_COMMUNITY)
Admission: EM | Admit: 2020-06-17 | Discharge: 2020-06-17 | Disposition: A | Discharge status: Home / Self Care | Payer: Self-pay | Attending: Nurse Practitioner | Admitting: Nurse Practitioner

## 2020-06-17 ENCOUNTER — Encounter (HOSPITAL_COMMUNITY): Payer: Self-pay

## 2020-06-17 ENCOUNTER — Other Ambulatory Visit: Payer: Self-pay

## 2020-06-17 DIAGNOSIS — T162XXA Foreign body in left ear, initial encounter: Secondary | ICD-10-CM

## 2020-06-17 DIAGNOSIS — H6982 Other specified disorders of Eustachian tube, left ear: Secondary | ICD-10-CM

## 2020-06-17 DIAGNOSIS — H9202 Otalgia, left ear: Secondary | ICD-10-CM

## 2020-06-17 MED ORDER — NEOMYCIN-POLYMYXIN-HC 3.5-10000-1 OT SUSP: 4.0000 [drp] | Freq: Three times a day (TID) | OTIC | 0 refills | Status: AC

## 2020-06-17 MED ORDER — FLUTICASONE PROPIONATE 50 MCG/ACT NA SUSP: 2.0000 | Freq: Every day | NASAL | 0 refills | Status: AC

## 2020-06-17 NOTE — Discharge Instructions (Signed)
Take medications as prescribed. Do not put anything into the ears. Follow up with ENT if no improvement in symptoms.

## 2020-06-17 NOTE — ED Triage Notes (Signed)
Pt is here with left ear pain that started after digging out a spider a week ago, pt has used ear wax removal to relieve discomfort.

## 2020-06-17 NOTE — ED Provider Notes (Signed)
[facility]    CSN: [id number] Arrival date & time: 06/17/20  1317      History   Chief Complaint Chief Complaint  Patient presents with  . Otalgia    HPI Russell Holland is a 34 y.o. male.   Subjective:   Russell Holland is a 34 y.o. male who presents for possible ear infection. Symptoms include left ear pain and plugged sensation in the left ear. Onset of symptoms was about 1 week ago and has been unchanged since that time. Patient reports that a spider was in his ear. He was able to get it out in one piece. He has a picture in his phone of the insect that was retrieved out his left ear. He denies any congestion, facial pain, vertigo or headache.  The following portions of the patient's history were reviewed and updated as appropriate: allergies, current medications, past family history, past medical history, past social history, past surgical history and problem list.       Past Medical History:  Diagnosis Date  . Eczema     There are no problems to display for this patient.   History reviewed. No pertinent surgical history.     Home Medications    Prior to Admission medications   Medication Sig Start Date End Date Taking? Authorizing Provider  fluticasone (FLONASE) 50 MCG/ACT nasal spray Place 2 sprays into both nostrils daily. 06/17/20   Lurline Idol, FNP  Multiple Vitamin (MULTIVITAMIN WITH MINERALS) TABS tablet Take 1 tablet by mouth daily.    [provider]  neomycin-polymyxin-hydrocortisone (CORTISPORIN) 3.5-10000-1 OTIC suspension Place 4 drops into the left ear 3 (three) times daily for 7 days. 06/17/20 06/24/20  Lurline Idol, FNP    Family History Family History  Problem Relation Age of Onset  . Diabetes Other   . Cancer Mother   . Cancer Father     Social History Social History   Tobacco Use  . Smoking status: Current Every Day Smoker    Packs/day: 0.50    Types: Cigars  . Smokeless tobacco: Never Used    Substance Use Topics  . Alcohol use: Yes  . Drug use: Yes    Types: Marijuana     Allergies   Patient has no known allergies.   Review of Systems Review of Systems  HENT: Positive for ear pain. Negative for congestion, rhinorrhea, sinus pressure, sinus pain, sore throat and tinnitus.   Neurological: Negative for dizziness and headaches.  All other systems reviewed and are negative.    Physical Exam Triage Vital Signs ED Triage Vitals  Enc Vitals Group     BP 06/17/20 1545 138/88     Pulse Rate 06/17/20 1545 73     Resp 06/17/20 1545 18     Temp 06/17/20 1545 98.1 F (36.7 C)     Temp Source 06/17/20 1545 Oral     SpO2 06/17/20 1545 99 %     Weight --      Height --      Head Circumference --      Peak Flow --      Pain Score 06/17/20 1541 3     Pain Loc --      Pain Edu? --      Excl. in GC? --    No data found.  Updated Vital Signs BP 138/88 (BP Location: Right Arm)   Pulse 73   Temp 98.1 F (36.7 C) (Oral)   Resp 18   SpO2  99%   Visual Acuity Right Eye Distance:   Left Eye Distance:   Bilateral Distance:    Right Eye Near:   Left Eye Near:    Bilateral Near:     Physical Exam Vitals reviewed.  Constitutional:      Appearance: Normal appearance.  HENT:     Head: Normocephalic.     Right Ear: Hearing, tympanic membrane, ear canal and external ear normal. No drainage, swelling or tenderness. There is no impacted cerumen. Tympanic membrane is not erythematous or retracted.     Nose: Nose normal.  Eyes:     Extraocular Movements: Extraocular movements intact.     Pupils: Pupils are equal, round, and reactive to light.  Cardiovascular:     Rate and Rhythm: Normal rate.  Pulmonary:     Effort: Pulmonary effort is normal.  Musculoskeletal:        General: Normal range of motion.     Cervical back: Normal range of motion and neck supple.  Skin:    General: Skin is warm and dry.  Neurological:     General: No focal deficit present.     Mental  Status: He is alert and oriented to person, place, and time.      UC Treatments / Results  Labs (all labs ordered are listed, but only abnormal results are displayed) Labs Reviewed - No data to display  EKG   Radiology No results found.  Procedures Ear Cerumen Removal  Date/Time: 06/17/2020 3:56 PM Performed by: Lurline Idol, FNP Authorized by: Lurline Idol, FNP   Consent:    Consent obtained:  Verbal   Consent given by:  Patient   Risks discussed:  Bleeding, infection, pain, dizziness and TM perforation   Alternatives discussed:  No treatment Procedure details:    Location:  L ear   Procedure type: irrigation   Post-procedure details:    Inspection:  TM intact   Hearing quality:  Normal   Patient tolerance of procedure:  Tolerated well, no immediate complications   (including critical care time)  Medications Ordered in UC Medications - No data to display  Initial Impression / Assessment and Plan / UC Course  I have reviewed the triage vital signs and the nursing notes.  Pertinent labs & imaging results that were available during my care of the patient were reviewed by me and considered in my medical decision making (see chart for details).     34 yo male presenting with left ear pain, plugged sensation in the ear and decreased hearing x 1 week after a spider came out of his ear. He hasn't tried anything for the pain. He denies denies any congestion, facial pain, vertigo or headache. Ear exam unremarkable. Irrigation of ear tried to see if there would be any subjective improvement.  Patient reports some improvement in the sensation that he has had in the ear.  Will treat with fluticasone and Cortisporin Otic drops. Return precautions discussed. Advised ENT follow-up if no improvement in symptoms.   Today's evaluation has revealed no signs of a dangerous process. Discussed diagnosis with patient and/or guardian. Patient and/or guardian aware of their  diagnosis, possible red flag symptoms to watch out for and need for close follow up. Patient and/or guardian understands verbal and written discharge instructions. Patient and/or guardian comfortable with plan and disposition.  Patient and/or guardian has a clear mental status at this time, good insight into illness (after discussion and teaching) and has clear judgment to make decisions regarding their  care  This care was provided during an unprecedented National Emergency due to the Novel Coronavirus (COVID-19) pandemic. COVID-19 infections and transmission risks place heavy strains on healthcare resources.  As this pandemic evolves, our facility, providers, and staff strive to respond fluidly, to remain operational, and to provide care relative to available resources and information. Outcomes are unpredictable and treatments are without well-defined guidelines. Further, the impact of COVID-19 on all aspects of urgent care, including the impact to patients seeking care for reasons other than COVID-19, is unavoidable during this national emergency. At this time of the global pandemic, management of patients has significantly changed, even for non-COVID positive patients given high local and regional COVID volumes at this time requiring high healthcare system and resource utilization. The standard of care for management of both COVID suspected and non-COVID suspected patients continues to change rapidly at the local, regional, national, and global levels. This patient was worked up and treated to the best available but ever changing evidence and resources available at this current time.   Documentation was completed with the aid of voice recognition software. Transcription may contain typographical errors.   Final Clinical Impressions(s) / UC Diagnoses   Final diagnoses:  Otalgia of left ear  Foreign body of left ear, initial encounter  Eustachian tube dysfunction, left     Discharge Instructions      Take medications as prescribed. Do not put anything into the ears. Follow up with ENT if no improvement in symptoms.     ED Prescriptions    Medication Sig Dispense Auth. Provider   neomycin-polymyxin-hydrocortisone (CORTISPORIN) 3.5-10000-1 OTIC suspension Place 4 drops into the left ear 3 (three) times daily for 7 days. 4.2 mL Lurline Idol, FNP   fluticasone (FLONASE) 50 MCG/ACT nasal spray Place 2 sprays into both nostrils daily. 16 g Lurline Idol, FNP     PDMP not reviewed this encounter.   Lurline Idol, [place] 06/17/20 [phone]

## 2022-06-12 ENCOUNTER — Encounter (HOSPITAL_COMMUNITY): Payer: Self-pay

## 2022-06-12 ENCOUNTER — Ambulatory Visit (HOSPITAL_COMMUNITY)
Admission: EM | Admit: 2022-06-12 | Discharge: 2022-06-12 | Disposition: A | Discharge status: Home / Self Care | Payer: Self-pay | Attending: Physician Assistant | Admitting: Physician Assistant

## 2022-06-12 DIAGNOSIS — M7918 Myalgia, other site: Secondary | ICD-10-CM

## 2022-06-12 MED ORDER — CYCLOBENZAPRINE HCL 5 MG PO TABS: 5.0000 mg | ORAL_TABLET | Freq: Three times a day (TID) | ORAL | 0 refills | Status: AC | PRN
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
## 2022-06-12 NOTE — Discharge Instructions (Addendum)
Recommend ice to affected areas, rest, stretching Can take Flexeril as needed for muscle spasm
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
## 2022-10-31 ENCOUNTER — Emergency Department (HOSPITAL_COMMUNITY): Payer: Self-pay

## 2022-10-31 ENCOUNTER — Other Ambulatory Visit: Payer: Self-pay

## 2022-10-31 ENCOUNTER — Emergency Department (HOSPITAL_COMMUNITY)
Admission: EM | Admit: 2022-10-31 | Discharge: 2022-10-31 | Disposition: A | Discharge status: Home / Self Care | Payer: Self-pay | Attending: Emergency Medicine | Admitting: Emergency Medicine

## 2022-10-31 ENCOUNTER — Encounter (HOSPITAL_COMMUNITY): Payer: Self-pay

## 2022-10-31 DIAGNOSIS — S91332A Puncture wound without foreign body, left foot, initial encounter: Secondary | ICD-10-CM | POA: Insufficient documentation

## 2022-10-31 DIAGNOSIS — M79672 Pain in left foot: Secondary | ICD-10-CM

## 2022-10-31 DIAGNOSIS — W450XXA Nail entering through skin, initial encounter: Secondary | ICD-10-CM | POA: Insufficient documentation

## 2022-10-31 DIAGNOSIS — Y92007 Garden or yard of unspecified non-institutional (private) residence as the place of occurrence of the external cause: Secondary | ICD-10-CM | POA: Insufficient documentation

## 2022-10-31 DIAGNOSIS — Y9389 Activity, other specified: Secondary | ICD-10-CM | POA: Insufficient documentation

## 2022-10-31 DIAGNOSIS — Z23 Encounter for immunization: Secondary | ICD-10-CM | POA: Insufficient documentation

## 2022-10-31 MED ORDER — TETANUS-DIPHTH-ACELL PERTUSSIS 5-2.5-18.5 LF-MCG/0.5 IM SUSY
0.5000 mL | PREFILLED_SYRINGE | Freq: Once | INTRAMUSCULAR | Status: AC
  Administered 2022-10-31: 0.5 mL via INTRAMUSCULAR
  Filled 2022-10-31: qty 0.5

## 2022-10-31 MED ORDER — CIPROFLOXACIN HCL 500 MG PO TABS: 500.0000 mg | ORAL_TABLET | Freq: Two times a day (BID) | ORAL | 0 refills | Status: AC

## 2022-10-31 NOTE — ED Triage Notes (Addendum)
Pt states that he stepped on a rusty nail doing yard work today. Pt has a small mark to his left foot.

## 2022-10-31 NOTE — ED Provider Notes (Signed)
[place] EMERGENCY DEPARTMENT AT [facility] Provider Note   CSN: [id number] Arrival date & time: 10/31/22  1904     History  Chief Complaint  Patient presents with   Foot Pain    GJON MAUS is a 37 y.o. male.  37 y.o male with no PMH presents to the ED with a chief complaint of left foot pain x today. Patient reports he was outside doing some yard work wearing his crocs, when suddenly he stepped on a piece of wood that had some resting nails on it.He reports pain along the dorsum aspect of the left foot.  He did wash his foot out, reports his last tetanus immunization is unknown.  He denies any other injury or complaint on today's visit.  The history is provided by the patient and medical records.  Foot Pain This is a new problem.       Home Medications Prior to Admission medications   Medication Sig Start Date End Date Taking? Authorizing Provider  ciprofloxacin (CIPRO) 500 MG tablet Take 1 tablet (500 mg total) by mouth 2 (two) times daily for 7 days. 10/31/22 11/07/22 Yes Chara Marquard, Beverley Fiedler, PA-C  cyclobenzaprine (FLEXERIL) 5 MG tablet Take 1 tablet (5 mg total) by mouth 3 (three) times daily as needed for muscle spasms. 06/12/22   Ward, Lenise Arena, PA-C  fluticasone (FLONASE) 50 MCG/ACT nasal spray Place 2 sprays into both nostrils daily. 06/17/20   Enrique Sack, FNP  Multiple Vitamin (MULTIVITAMIN WITH MINERALS) TABS tablet Take 1 tablet by mouth daily.    [provider]      Allergies    Cheese    Review of Systems   Review of Systems  Constitutional:  Negative for fever.  Skin:  Positive for wound.    Physical Exam Updated Vital Signs BP (!) 162/88 (BP Location: Right Arm)   Pulse 66   Temp 98.2 F (36.8 C) (Oral)   Resp 18   Ht 6' (1.829 m)   Wt 99.8 kg   SpO2 100%   BMI 29.84 kg/m  Physical Exam Vitals and nursing note reviewed.  Constitutional:      Appearance: Normal appearance.  HENT:     Head: Normocephalic and  atraumatic.     Mouth/Throat:     Mouth: Mucous membranes are moist.  Eyes:     Pupils: Pupils are equal, round, and reactive to light.  Cardiovascular:     Rate and Rhythm: Normal rate.     Pulses:          Dorsalis pedis pulses are 2+ on the left side.       Posterior tibial pulses are 2+ on the left side.  Pulmonary:     Effort: Pulmonary effort is normal.  Abdominal:     General: Abdomen is flat.  Musculoskeletal:     Cervical back: Normal range of motion and neck supple.       Feet:  Feet:     Comments: Pinpoint wound noted to the left dorsum aspect of the foot. Skin:    General: Skin is warm and dry.  Neurological:     Mental Status: He is alert and oriented to person, place, and time.     ED Results / Procedures / Treatments   Labs (all labs ordered are listed, but only abnormal results are displayed) Labs Reviewed - No data to display  EKG None  Radiology DG Foot 2 Views Left  Result Date: 10/31/2022 CLINICAL DATA:  Injury.  Stepped on rusty nail with entry point at base of fifth metatarsal. EXAM: LEFT FOOT - 2 VIEW COMPARISON:  None Available. FINDINGS: There is no evidence of fracture or dislocation. There is no evidence of arthropathy or other focal bone abnormality. No radiopaque foreign body is seen. Soft tissues are unremarkable. IMPRESSION: No acute osseous abnormality or radiopaque foreign body. Electronically Signed   By: Brett Fairy M.D.   On: 10/31/2022 20:24    Procedures Procedures    Medications Ordered in ED Medications  Tdap (BOOSTRIX) injection 0.5 mL (0.5 mLs Intramuscular Given 10/31/22 2048)    ED Course/ Medical Decision Making/ A&P                             Medical Decision Making Amount and/or Complexity of Data Reviewed Radiology: ordered.  Risk Prescription drug management.  Patient presents to the ED status post left foot injury.  Patient reports he was doing yard work when suddenly he stepped on a nail.  Reports pain  along the dorsum aspect of his foot.  Now placed on ciprofloxacin to help prevent infection.  His last tetanus immunization is unknown, this was updated on today's visit.  X-ray of the left foot did not show any acute findings.  We discussed symptomatic treatment, will follow-up with primary care physician as needed.  Patient is hemodynamically stable for discharge.   Portions of this note were generated with [employer]. Dictation errors may occur despite best attempts at proofreading.   Final Clinical Impression(s) / ED Diagnoses Final diagnoses:  Left foot pain    Rx / DC Orders ED Discharge Orders          Ordered    ciprofloxacin (CIPRO) 500 MG tablet  2 times daily        10/31/22 2058              Janeece Fitting, PA-C 10/31/22 2118    Drenda Freeze, MD 10/31/22 2238

## 2022-10-31 NOTE — Discharge Instructions (Addendum)
I have prescribed medication to help antibiotics to help prevent any infection. Please take 1 tablet twice a day for the next 7 days.   You receive a tetanus shot on todays visit.

## 2023-11-29 DIAGNOSIS — Z6828 Body mass index (BMI) 28.0-28.9, adult: Secondary | ICD-10-CM | POA: Diagnosis not present

## 2023-11-29 DIAGNOSIS — L739 Follicular disorder, unspecified: Secondary | ICD-10-CM | POA: Diagnosis not present

## 2023-11-29 DIAGNOSIS — J069 Acute upper respiratory infection, unspecified: Secondary | ICD-10-CM | POA: Diagnosis not present

## 2023-11-29 DIAGNOSIS — L0102 Bockhart's impetigo: Secondary | ICD-10-CM | POA: Diagnosis not present
